# Patient Record
Sex: Male | Born: 1960 | Race: White | Hispanic: No | Marital: Married | Smoking: Current every day smoker
Health system: Southern US, Community
[De-identification: ages and names within clinical notes are randomized; demographics above are authoritative.]

---

## 1998-05-01 ENCOUNTER — Emergency Department (HOSPITAL_COMMUNITY): Admission: EM | Admit: 1998-05-01 | Discharge: 1998-05-01 | Payer: Self-pay | Admitting: Emergency Medicine

## 2010-11-27 ENCOUNTER — Emergency Department (INDEPENDENT_AMBULATORY_CARE_PROVIDER_SITE_OTHER): Payer: No Typology Code available for payment source

## 2010-11-27 ENCOUNTER — Emergency Department (HOSPITAL_BASED_OUTPATIENT_CLINIC_OR_DEPARTMENT_OTHER)
Admission: EM | Admit: 2010-11-27 | Discharge: 2010-11-27 | Disposition: A | Discharge status: Home / Self Care | Payer: No Typology Code available for payment source | Attending: Emergency Medicine | Admitting: Emergency Medicine

## 2010-11-27 DIAGNOSIS — M542 Cervicalgia: Secondary | ICD-10-CM

## 2010-11-27 DIAGNOSIS — M545 Low back pain, unspecified: Secondary | ICD-10-CM

## 2010-11-27 DIAGNOSIS — M25569 Pain in unspecified knee: Secondary | ICD-10-CM

## 2010-11-27 DIAGNOSIS — S139XXA Sprain of joints and ligaments of unspecified parts of neck, initial encounter: Secondary | ICD-10-CM | POA: Insufficient documentation

## 2010-11-27 DIAGNOSIS — S335XXA Sprain of ligaments of lumbar spine, initial encounter: Secondary | ICD-10-CM | POA: Insufficient documentation

## 2010-11-27 DIAGNOSIS — Y92410 Unspecified street and highway as the place of occurrence of the external cause: Secondary | ICD-10-CM | POA: Insufficient documentation
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
## 2010-12-13 ENCOUNTER — Emergency Department (HOSPITAL_BASED_OUTPATIENT_CLINIC_OR_DEPARTMENT_OTHER)
Admission: EM | Admit: 2010-12-13 | Discharge: 2010-12-13 | Disposition: A | Discharge status: Home / Self Care | Payer: No Typology Code available for payment source | Attending: Emergency Medicine | Admitting: Emergency Medicine

## 2010-12-13 DIAGNOSIS — F17200 Nicotine dependence, unspecified, uncomplicated: Secondary | ICD-10-CM | POA: Insufficient documentation

## 2010-12-13 DIAGNOSIS — M542 Cervicalgia: Secondary | ICD-10-CM | POA: Insufficient documentation

## 2010-12-13 DIAGNOSIS — M5430 Sciatica, unspecified side: Secondary | ICD-10-CM | POA: Insufficient documentation

## 2021-04-01 ENCOUNTER — Other Ambulatory Visit: Payer: Self-pay | Admitting: Physician Assistant

## 2021-04-01 ENCOUNTER — Other Ambulatory Visit: Payer: Self-pay

## 2021-04-01 DIAGNOSIS — S8392XA Sprain of unspecified site of left knee, initial encounter: Secondary | ICD-10-CM

## 2021-04-01 DIAGNOSIS — R200 Anesthesia of skin: Secondary | ICD-10-CM

## 2021-04-04 ENCOUNTER — Ambulatory Visit
Admission: RE | Admit: 2021-04-04 | Discharge: 2021-04-04 | Disposition: A | Discharge status: Home / Self Care | Payer: Worker's Compensation | Source: Ambulatory Visit | Attending: Physician Assistant | Admitting: Physician Assistant

## 2021-04-04 ENCOUNTER — Other Ambulatory Visit: Payer: Self-pay

## 2021-04-04 DIAGNOSIS — R200 Anesthesia of skin: Secondary | ICD-10-CM

## 2021-04-04 DIAGNOSIS — S8392XA Sprain of unspecified site of left knee, initial encounter: Secondary | ICD-10-CM

## 2021-04-04 IMAGING — MR MR KNEE*L* W/O CM
6 series · 40 of 40 positions shown · non-contrast
Comparison: None.

CLINICAL DATA: Anterior knee pain after jumping injury on
[DATE].

EXAM:
MRI OF THE LEFT KNEE WITHOUT CONTRAST
TECHNIQUE: Multiplanar, multisequence MR imaging of the knee was performed. No
intravenous contrast was administered.

[Series 6: T2 fat-sat · axial · left · 4.0mm · 0.53mm/px · z∈[-111,+60]mm · 9 of 40 slices shown (1 of 3)]
[im 1/40]
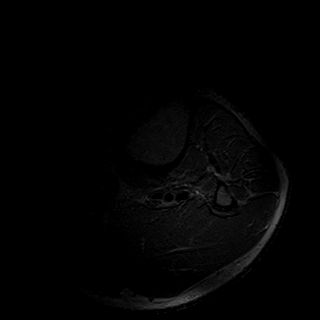
[im 5/40]
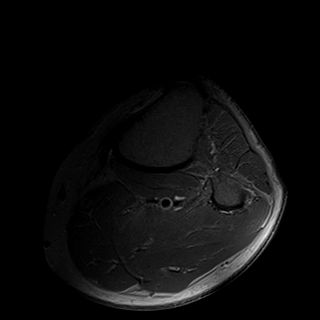
[im 10/40]
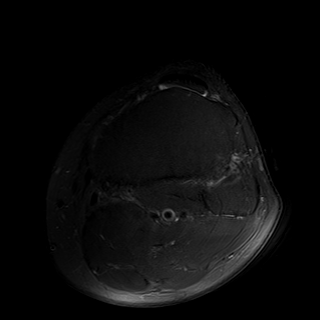
[im 15/40]
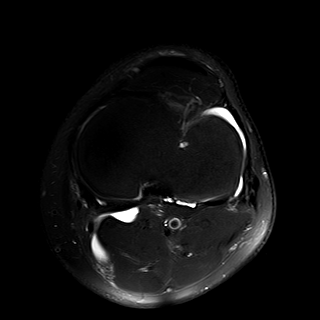
[im 20/40]
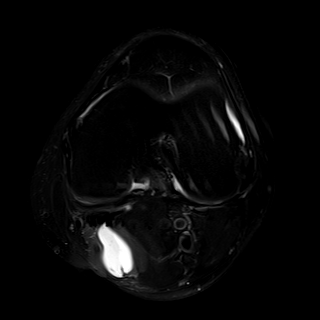
[im 25/40]
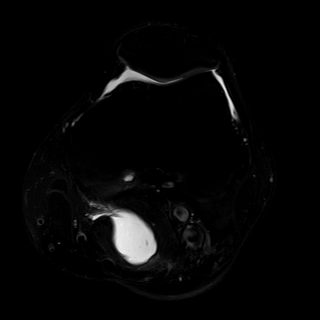
[im 30/40]
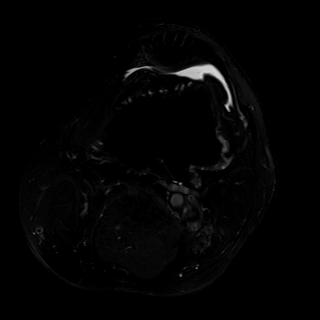
[im 35/40]
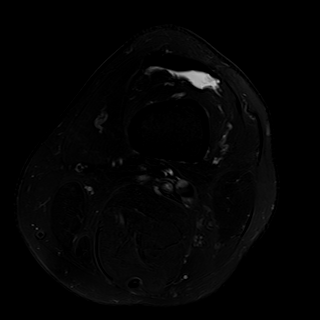
[im 40/40]
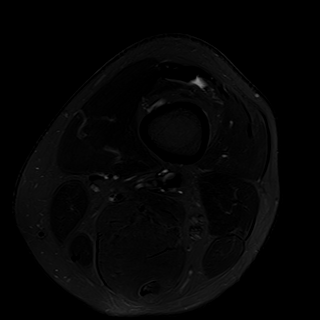

[Series 7: T2 fat-sat · coronal · left · 4.0mm · 0.50mm/px · 7 of 33 slices shown (2 of 3)]
[im 1/33]
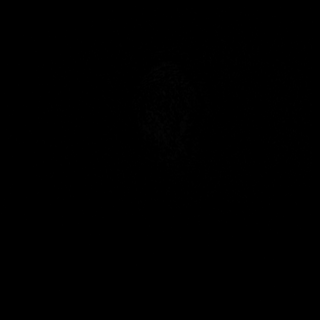
[im 6/33]
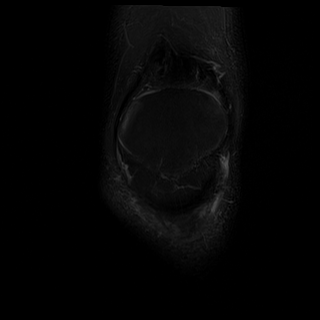
[im 11/33]
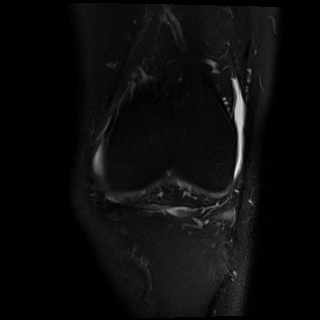
[im 17/33]
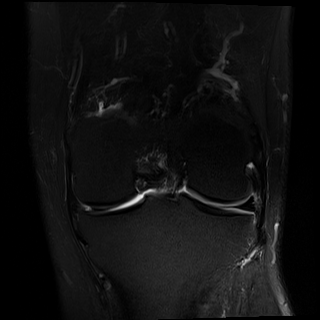
[im 22/33]
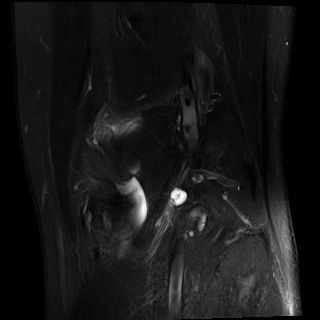
[im 27/33]
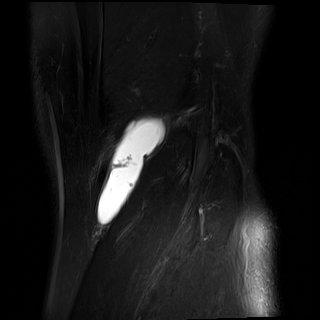
[im 33/33]
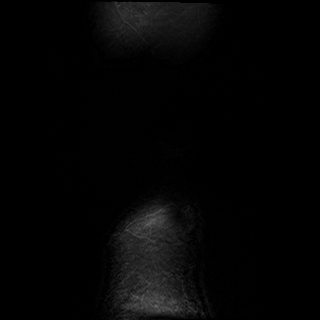

[Series 8: T1 · coronal · left · 4.0mm · 0.50mm/px · 6 of 32 slices shown]
[im 1/32]
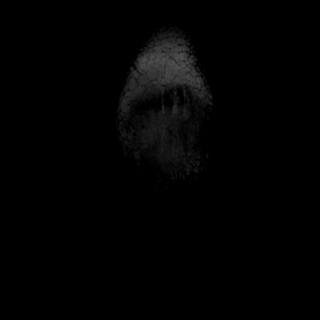
[im 7/32]
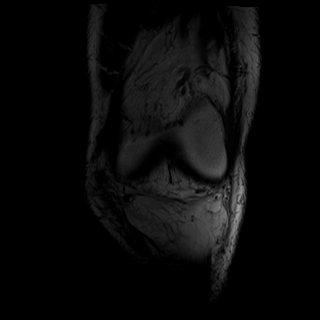
[im 13/32]
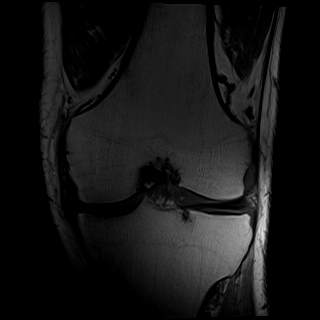
[im 19/32]
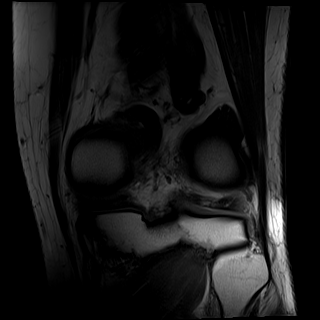
[im 25/32]
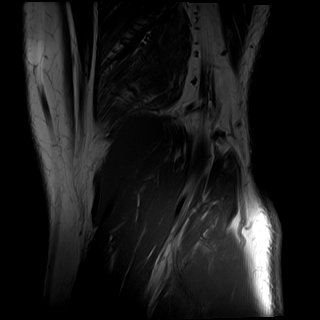
[im 32/32]
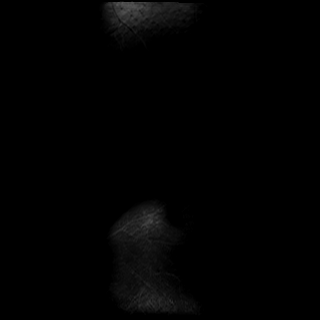

[Series 9: PD fat-sat · coronal · left · 3.0mm · 0.47mm/px · 6 of 30 slices shown (1 of 2)]
[im 1/30]
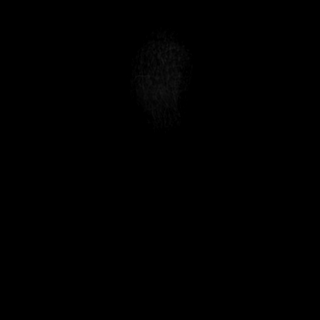
[im 6/30]
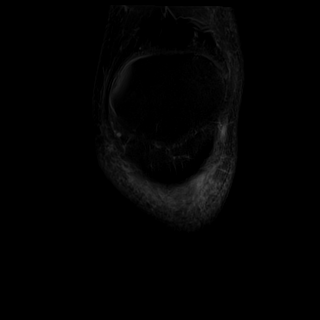
[im 12/30]
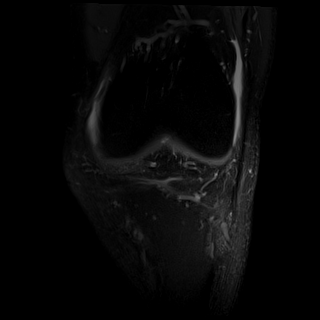
[im 18/30]
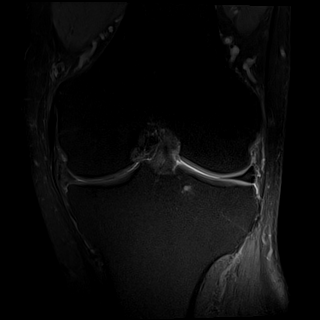
[im 24/30]
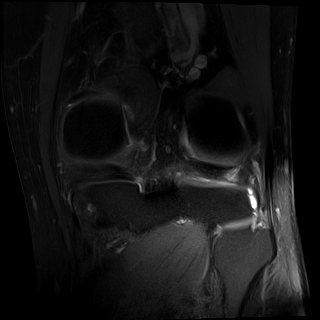
[im 30/30]
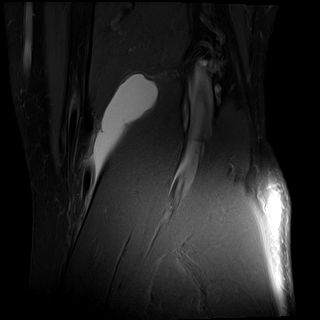

[Series 10: PD fat-sat · sagittal · left · 3.0mm · 0.53mm/px · 6 of 31 slices shown (2 of 2)]
[im 1/31]
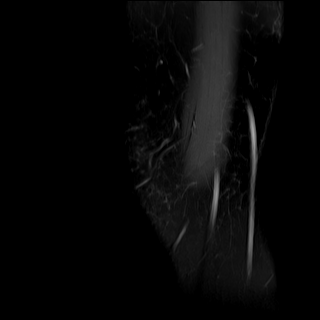
[im 7/31]
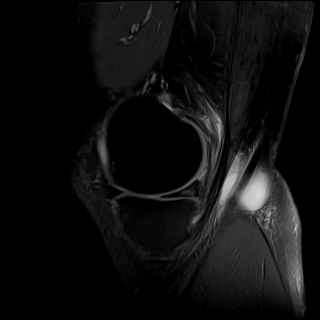
[im 13/31]
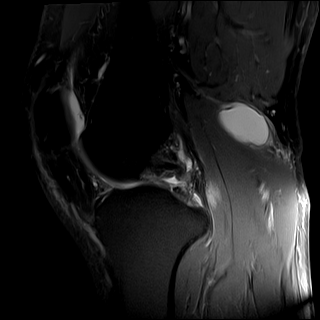
[im 19/31]
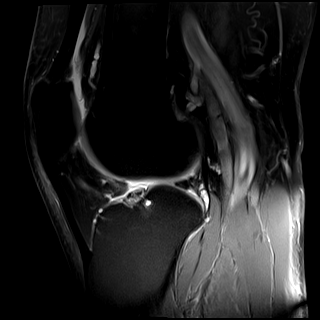
[im 25/31]
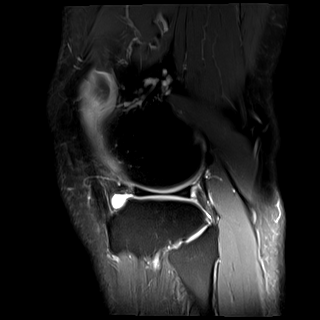
[im 31/31]
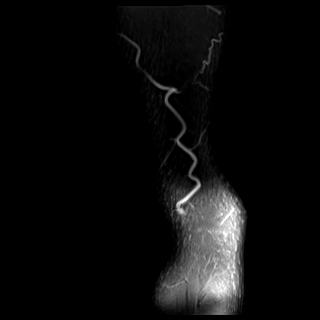

[Series 11: T2 fat-sat · sagittal · left · 3.0mm · 0.53mm/px · 6 of 32 slices shown (3 of 3)]
[im 1/32]
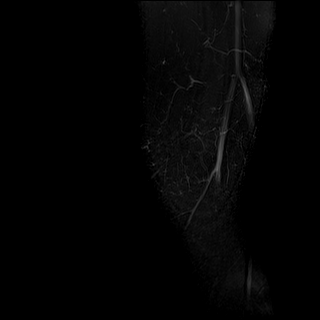
[im 7/32]
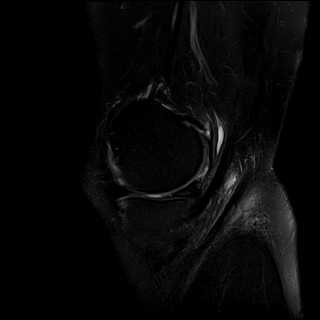
[im 13/32]
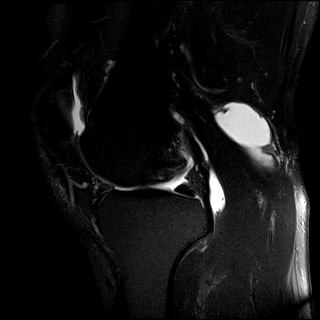
[im 19/32]
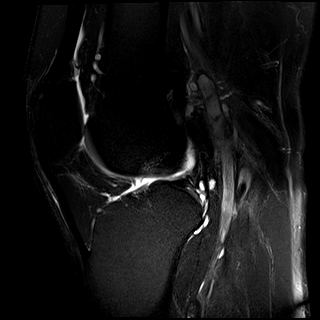
[im 25/32]
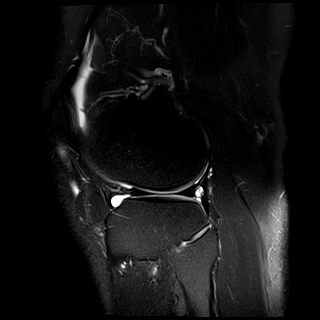
[im 32/32]
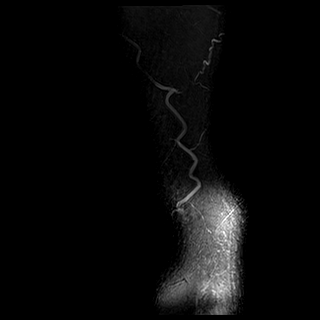

[40 of 40 positions shown; findings below may reference images not displayed]

FINDINGS: MENISCI

Medial meniscus: HYELLAMADA radial tear of the midbody (series 6,
images 24-25). Intermediate intrasubstance signal within the
remainder of the medial meniscal body and the posterior horn which
may represent intrasubstance degeneration or meniscal contusion.

Lateral meniscus:  Intact.

LIGAMENTS

Cruciates:  Intact ACL and PCL.

Collaterals: Medial collateral ligament is intact. Lateral
collateral ligament complex is intact.

CARTILAGE

Patellofemoral: Full-thickness chondral defect within the central
trochlear groove measuring 8 x 2 mm. Partial thickness chondral
fissure along the inferior aspect of the lateral patellar facet.

Medial:  No chondral defect.

Lateral:  No chondral defect.

Joint:  Small knee joint effusion.  Fat pads within normal limits.

Popliteal Fossa: Moderate-sized Baker's cyst measuring up to 6.0 cm
in length. Intact popliteus tendon.

Extensor Mechanism:  Intact quadriceps tendon and patellar tendon.

Bones: No focal marrow signal abnormality. No fracture or
dislocation.

Other: Multilobulated cystic structure along the posterior aspect of
the lateral tibial plateau measuring 2.3 x 0.8 x 2.0 cm, likely
ganglion cyst.
IMPRESSION: 1. Radial tear of the medial meniscal body.
2. Full-thickness chondral defect within the central trochlear
groove measuring 8 x 2 mm.
3. Small knee joint effusion.
4. Moderate-sized Baker's cyst measuring up to 6.0 cm in length.
5. Multilobulated cystic structure along the posterior aspect of the
lateral tibial plateau measuring 2.3 x 0.8 x 2.0 cm, likely ganglion
cyst.

## 2021-04-26 ENCOUNTER — Other Ambulatory Visit: Payer: Self-pay

## 2021-04-26 ENCOUNTER — Encounter: Payer: Self-pay | Admitting: Orthopaedic Surgery

## 2021-04-26 ENCOUNTER — Ambulatory Visit (INDEPENDENT_AMBULATORY_CARE_PROVIDER_SITE_OTHER): Admitting: Orthopaedic Surgery

## 2021-04-26 DIAGNOSIS — S83242D Other tear of medial meniscus, current injury, left knee, subsequent encounter: Secondary | ICD-10-CM | POA: Diagnosis not present

## 2021-04-26 MED ORDER — TRAMADOL HCL 50 MG PO TABS: 50.0000 mg | ORAL_TABLET | Freq: Four times a day (QID) | ORAL | 0 refills | Status: DC | PRN

## 2021-04-26 NOTE — Progress Notes (Signed)
Office Visit Note   Patient: Fred Rodriguez           Date of Birth: [birth year]           MRN: [id number] Visit Date: 04/26/2021              Requested by: No referring provider defined for this encounter. PCP: Pcp, No   Assessment & Plan: Visit Diagnoses:  1. Other tear of medial meniscus, current injury, left knee, subsequent encounter     Plan: Fred Rodriguez sustained an on-the-job injury to his left knee on March 09, 2021.  He jumped about 5 to 6 feet off of his truck landing on both feet with immediate onset of left knee pain.  He was seen by his primary care physician with x-rays negative for any acute injury.  He then had an MRI scan of his knee performed on 04/04/2021.  This demonstrated a parrot-beak radial tear of the mid body of the medial meniscus.  Lateral meniscus was intact.  ACL and PCL were intact.  Collateral ligaments intact.  There was an area of full-thickness chondral defect within the central trochlear groove measuring 8 x 2 mm and some partial-thickness chondral fissure along the inferior aspect of the lateral patella facet.  No chondral defects in the medial lateral compartment.  There was a moderate sized Baker's cyst measuring up to 6 cm.  Also was a lateral tibial plateau ganglion cyst measuring 2.3 x 1.8 x 2 cm.  No symptoms prior to the injury.  He continues to have trouble walking but tickly up and down steps and sleeping.  I believe the medial meniscus tear is symptomatic.  He does not have any pain with patella compression or flexion extension.  All this pain is localized along the medial compartment.  Long discussion regarding the pathology and findings on the MRI scan.  I would suggest a knee arthroscopy.  Discussed this with him in detail regarding the incisions, outpatient nature what he may expect postoperatively.  We will give him a note saying he cannot work until after surgery and tramadol for pain  Follow-Up Instructions: Return We will schedule knee arthroscopy.    Orders:  No orders of the defined types were placed in this encounter.  No orders of the defined types were placed in this encounter.     Procedures: No procedures performed   Clinical Data: No additional findings.   Subjective: Chief Complaint  Patient presents with   Left Knee - Pain  Patient presents today for his left knee. He states that he jumped off the hood of a truck on 03/09/2021 and landed on his feet. He states that he had pain in the medial side of his knee. He feels like he reinjured his left knee from jumping down. His original injury occurred at the end of 2021. He went to [facility] and had x-rays. He then had an MRI scan. He states that he experiences intense pain. He cannot sleep. He is taking prednisone and states that it has not helped. 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   HPI  Review of Systems   Objective: Vital Signs: Ht 6\' 1"  (1.854 m)   Wt 260 lb (117.9 kg)   BMI 34.30 kg/m   Physical Exam Constitutional:      Appearance: He is well-developed.  Eyes:     Pupils: Pupils are equal, round, and reactive to light.  Pulmonary:     Effort: Pulmonary effort is normal.  Skin:  General: Skin is warm and dry.  Neurological:     Mental Status: He is alert and oriented to person, place, and time.  Psychiatric:        Behavior: Behavior normal.    Ortho Exam left knee with diffuse posterior medial joint pain.  Very minimal effusion.  Some patella crepitation but no pain with patella compression.  Full extension and flexion at least 205 degrees.  No instability.  No pain about the collateral ligaments.  Negative anterior drawer sign.  I did not feel any popliteal fullness.  No calf pain.  No distal edema.  Straight leg raise negative.  Specialty Comments:  No specialty comments available.  Imaging: No results found.   PMFS History: Patient Active Problem List   Diagnosis Date Noted   Other tear of medial meniscus, current injury, left knee, subsequent  encounter 04/26/2021   History reviewed. No pertinent past medical history.  History reviewed. No pertinent family history.  History reviewed. No pertinent surgical history. Social History   Occupational History   Not on file  Tobacco Use   Smoking status: Not on file   Smokeless tobacco: Not on file  Substance and Sexual Activity   Alcohol use: Not on file   Drug use: Not on file   Sexual activity: Not on file

## 2021-05-12 ENCOUNTER — Other Ambulatory Visit: Payer: Self-pay | Admitting: Orthopaedic Surgery

## 2021-05-12 ENCOUNTER — Encounter: Payer: Self-pay | Admitting: Orthopaedic Surgery

## 2021-05-12 DIAGNOSIS — M23222 Derangement of posterior horn of medial meniscus due to old tear or injury, left knee: Secondary | ICD-10-CM | POA: Diagnosis not present

## 2021-05-12 DIAGNOSIS — M94262 Chondromalacia, left knee: Secondary | ICD-10-CM | POA: Diagnosis not present

## 2021-05-12 DIAGNOSIS — M2242 Chondromalacia patellae, left knee: Secondary | ICD-10-CM | POA: Diagnosis not present

## 2021-05-12 MED ORDER — HYDROCODONE-ACETAMINOPHEN 5-325 MG PO TABS: 1.0000 | ORAL_TABLET | Freq: Four times a day (QID) | ORAL | 0 refills | Status: DC | PRN

## 2021-05-19 ENCOUNTER — Encounter: Payer: Self-pay | Admitting: Orthopaedic Surgery

## 2021-05-19 ENCOUNTER — Encounter: Payer: Self-pay | Admitting: Orthopedic Surgery

## 2021-05-19 ENCOUNTER — Other Ambulatory Visit: Payer: Self-pay

## 2021-05-19 ENCOUNTER — Ambulatory Visit (INDEPENDENT_AMBULATORY_CARE_PROVIDER_SITE_OTHER): Admitting: Orthopaedic Surgery

## 2021-05-19 DIAGNOSIS — S83242D Other tear of medial meniscus, current injury, left knee, subsequent encounter: Secondary | ICD-10-CM

## 2021-05-19 MED ORDER — LIDOCAINE HCL 1 % IJ SOLN
2.0000 mL | INTRAMUSCULAR | Status: AC | PRN
  Administered 2021-05-19: 2 mL

## 2021-05-19 NOTE — Progress Notes (Signed)
   Office Visit Note   Patient: Fred Rodriguez           Date of Birth: 03-12-1961           MRN: [id number] Visit Date: 05/19/2021              Requested by: No referring provider defined for this encounter. PCP: Pcp, No   Assessment & Plan: Visit Diagnoses:  1. Other tear of medial meniscus, current injury, left knee, subsequent encounter     Plan: 1 week status post left knee arthroscopy there was some chondromalacia the medial compartment but radial tear at the junction of the mid and posterior horns of the medial meniscus.  This was debrided.  He does have a hemarthrosis so I have aspirated his knee of about 50 cc of fluid and it made a big difference.  No evidence of infection or DVT.  We will maintain his out of work status and reevaluate in 1 week  Follow-Up Instructions: Return in about 1 week (around 05/26/2021).   Orders:  No orders of the defined types were placed in this encounter.  No orders of the defined types were placed in this encounter.     Procedures: Large Joint Inj: L knee on 05/19/2021 1:29 PM Indications: pain and diagnostic evaluation Details: 25 G 1.5 in needle, anteromedial approach  Arthrogram: No  Medications: 2 mL lidocaine 1 % Aspirate: 50 mL blood-tinged Outcome: tolerated well, no immediate complications Procedure, treatment alternatives, risks and benefits explained, specific risks discussed. Consent was given by the patient. Patient was prepped and draped in the usual sterile fashion.      Clinical Data: No additional findings.   Subjective: Chief Complaint  Patient presents with   Left Knee - Pain  1 week status post left knee arthroscopy for partial medial meniscectomy.  Doing well except that he is having some pressure feeling in his knee and has a positive effusion.  No shortness of breath or chest pain or calf discomfort.  Has had side effects from the pain medicine with difficulty sleeping.  HPI  Review of  Systems   Objective: Vital Signs: There were no vitals taken for this visit.  Physical Exam  Ortho Exam positive effusion left knee.  No significant tenderness.  Arthroscopic portals healing without problem.  No evidence of infection.  No thigh popliteal space or calf pain.  No distal edema.  Does have limited range of motion but is based on the effusion  Specialty Comments:  No specialty comments available.  Imaging: No results found.   PMFS History: Patient Active Problem List   Diagnosis Date Noted   Other tear of medial meniscus, current injury, left knee, subsequent encounter 04/26/2021   History reviewed. No pertinent past medical history.  History reviewed. No pertinent family history.  History reviewed. No pertinent surgical history. Social History   Occupational History   Not on file  Tobacco Use   Smoking status: Not on file   Smokeless tobacco: Not on file  Substance and Sexual Activity   Alcohol use: Not on file   Drug use: Not on file   Sexual activity: Not on file     Valeria Batman, MD   Note - This record has been created using [employer].  Chart creation errors have been sought, but may not always  have been located. Such creation errors do not reflect on  the standard of medical care.

## 2021-05-26 ENCOUNTER — Other Ambulatory Visit: Payer: Self-pay

## 2021-05-26 ENCOUNTER — Encounter: Payer: Self-pay | Admitting: Orthopaedic Surgery

## 2021-05-26 ENCOUNTER — Ambulatory Visit (INDEPENDENT_AMBULATORY_CARE_PROVIDER_SITE_OTHER): Admitting: Orthopaedic Surgery

## 2021-05-26 DIAGNOSIS — S83242D Other tear of medial meniscus, current injury, left knee, subsequent encounter: Secondary | ICD-10-CM

## 2021-05-26 NOTE — Progress Notes (Signed)
   Office Visit Note   Patient: Fred Rodriguez           Date of Birth: 1961/05/17           MRN: [id number] Visit Date: 05/26/2021              Requested by: No referring provider defined for this encounter. PCP: Pcp, No   Assessment & Plan: Visit Diagnoses:  1. Other tear of medial meniscus, current injury, left knee, subsequent encounter     Plan: 2-week status post left knee arthroscopy.  I performed a partial medial meniscectomy for a radial tear.  Last week I aspirated an effusion he notes that it made a huge difference.  It has not recurred.  He is doing well.  Not using any ambulatory aid.  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  He has not reached maximum medical improvement  Follow-Up Instructions: Return in about 2 weeks (around 06/09/2021).   Orders:  No orders of the defined types were placed in this encounter.  No orders of the defined types were placed in this encounter.     Procedures: No procedures performed   Clinical Data: No additional findings.   Subjective: Chief Complaint  Patient presents with   Left Knee - Follow-up    Left knee arthroscopy 05/12/2021  Patient presents today for follow up on his left knee. He is now two weeks out from a left knee arthroscopy. He is doing well.   HPI  Review of Systems   Objective: Vital Signs: There were no vitals taken for this visit.  Physical Exam  Ortho Exam left knee was not hot red warm or swollen.  Arthroscopic portals healing without problem.  No popliteal pain or mass.  No calf pain.  Neurologically intact.  Full quick extension and flexed over 105 degrees.  Minimal medial joint pain  Specialty Comments:  No specialty comments available.  Imaging: No results found.   PMFS History: Patient Active Problem List    Diagnosis Date Noted   Other tear of medial meniscus, current injury, left knee, subsequent encounter 04/26/2021   History reviewed. No pertinent past medical history.  History reviewed. No pertinent family history.  History reviewed. No pertinent surgical history. Social History   Occupational History   Not on file  Tobacco Use   Smoking status: Not on file   Smokeless tobacco: Not on file  Substance and Sexual Activity   Alcohol use: Not on file   Drug use: Not on file   Sexual activity: Not on file

## 2021-06-09 ENCOUNTER — Ambulatory Visit (INDEPENDENT_AMBULATORY_CARE_PROVIDER_SITE_OTHER): Admitting: Orthopaedic Surgery

## 2021-06-09 ENCOUNTER — Other Ambulatory Visit: Payer: Self-pay

## 2021-06-09 ENCOUNTER — Encounter: Payer: Self-pay | Admitting: Orthopaedic Surgery

## 2021-06-09 VITALS — Ht 73.0 in | Wt 260.0 lb

## 2021-06-09 DIAGNOSIS — S83242D Other tear of medial meniscus, current injury, left knee, subsequent encounter: Secondary | ICD-10-CM

## 2021-06-09 DIAGNOSIS — Z9889 Other specified postprocedural states: Secondary | ICD-10-CM

## 2021-06-09 NOTE — Progress Notes (Signed)
   Office Visit Note   Patient: Fred Rodriguez           Date of Birth: 12-08-1960           MRN: [id number] Visit Date: 06/09/2021              Requested by: No referring provider defined for this encounter. PCP: Pcp, No   Assessment & Plan: Visit Diagnoses:  1. S/P left knee arthroscopy   2. Other tear of medial meniscus, current injury, left knee, subsequent encounter     Plan: Fred Rodriguez is 1 month status post left knee arthroscopy had complex tear of the posterior third of the medial meniscus.  This was debrided back to stable meniscal rim.  He notes that he is doing quite well but still has occasional pain when he twists and turns.  Is not had any effusion or any evidence of infection.  His biggest concern is his weakness.  We will send him to physical therapy and check him back in a month.  He is seeing Fred Rodriguez for his back and is being kept out of work through his office  Follow-Up Instructions: Return in about 1 month (around 07/09/2021).   Orders:  Orders Placed This Encounter  Procedures   Ambulatory referral to Physical Therapy   No orders of the defined types were placed in this encounter.     Procedures: No procedures performed   Clinical Data: No additional findings.   Subjective: Chief Complaint  Patient presents with   Left Knee - Follow-up    Left knee arthroscopy 05/12/2021  Patient presents today for a two week follow up on his left knee. He is now a month out from a left knee arthroscopy. His surgery was on 05/12/2021. He states that he is doing well, but feels like he doesn't have much strength in that knee.  HPI  Review of Systems   Objective: Vital Signs: Ht 6\' 1"  (1.854 m)   Wt 260 lb (117.9 kg)   BMI 34.30 kg/m   Physical Exam  Ortho Exam left knee was not hot warm or red.  No effusion.  Full quick extension.  There is some atrophy of the left compared to the right thigh.  Flexed over 105 degrees.  Very minimal posterior medial joint  pain.  No patella crepitation and no lateral joint pain  Specialty Comments:  No specialty comments available.  Imaging: No results found.   PMFS History: Patient Active Problem List   Diagnosis Date Noted   Other tear of medial meniscus, current injury, left knee, subsequent encounter 04/26/2021   History reviewed. No pertinent past medical history.  History reviewed. No pertinent family history.  History reviewed. No pertinent surgical history. Social History   Occupational History   Not on file  Tobacco Use   Smoking status: Not on file   Smokeless tobacco: Not on file  Substance and Sexual Activity   Alcohol use: Not on file   Drug use: Not on file   Sexual activity: Not on file

## 2021-06-16 ENCOUNTER — Other Ambulatory Visit: Payer: Self-pay | Admitting: Orthopedic Surgery

## 2021-06-16 DIAGNOSIS — M545 Low back pain, unspecified: Secondary | ICD-10-CM

## 2021-06-30 ENCOUNTER — Ambulatory Visit (INDEPENDENT_AMBULATORY_CARE_PROVIDER_SITE_OTHER): Admitting: Orthopaedic Surgery

## 2021-06-30 ENCOUNTER — Other Ambulatory Visit: Payer: Self-pay

## 2021-06-30 ENCOUNTER — Encounter: Payer: Self-pay | Admitting: Orthopaedic Surgery

## 2021-06-30 DIAGNOSIS — S83242D Other tear of medial meniscus, current injury, left knee, subsequent encounter: Secondary | ICD-10-CM

## 2021-06-30 NOTE — Progress Notes (Signed)
Office Visit Note   Patient: Fred Rodriguez           Date of Birth: [birth year]           MRN: [id number] Visit Date: 06/30/2021              Requested by: No referring provider defined for this encounter. PCP: Pcp, No   Assessment & Plan: Visit Diagnoses:  1. Other tear of medial meniscus, current injury, left knee, subsequent encounter     Plan: Fred Rodriguez is nearly 2 months status post left knee arthroscopy.  He had a complex tear of the medial meniscus that was debrided.  There was some grade 2 changes of chondromalacia of the medial lateral compartment with grade 2 and 3 changes of chondromalacia at the patellofemoral articulation and the trochlear.  I think that is causing him more trouble as he is having difficulty with deep knee bends and squats.  The meniscal pain has resolved.  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  He will need to bend and squat without much pain.  He will continue with his physical therapy and I will monitor him on a monthly basis.  He also is being followed by Dr. Yevette Edwards for his lumbar spine and has MRI scan scheduled for tomorrow.  He has not reached maximal medical improvement and may not for up to 6 months.  Unable to work from my standpoint  Follow-Up Instructions: Return in about 1 month (around 07/31/2021).   Orders:  No orders of the defined types were placed in this encounter.  No orders of the defined types were placed in this encounter.     Procedures: No procedures performed   Clinical Data: No additional findings.   Subjective: Chief Complaint  Patient presents with   Left Knee - Follow-up    Left knee arthroscopy 05/12/2021  Patient presents today for follow up on his left knee. He had a left knee arthroscopy on 05/12/2021. He is going to physical therapy twice weekly. He is very discouraged today because he cannot get the range of motion and strength back into his leg. 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   HPI  Review of Systems   Objective: Vital Signs: There were no vitals taken for this visit.  Physical Exam  Ortho Exam left knee was not hot red warm or swollen.  No medial joint pain but there was pain with compression of the patella with flexion extension.  He might have a very minimal effusion.  There was some pain when he tried to perform a squat related to the anterior aspect of his knee.  No instability  Specialty Comments:  No specialty comments available.  Imaging: No results found.   PMFS History: Patient Active Problem List   Diagnosis Date Noted   Other tear of medial meniscus, current injury, left knee, subsequent encounter 04/26/2021   History reviewed. No pertinent past medical history.  History reviewed. No pertinent family history.  History reviewed. No pertinent surgical history. Social History   Occupational History   Not on file  Tobacco Use   Smoking status: Not on file   Smokeless tobacco: Not on file  Substance and Sexual Activity   Alcohol use: Not on file   Drug use: Not on file   Sexual activity: Not on file

## 2021-07-01 ENCOUNTER — Ambulatory Visit
Admission: RE | Admit: 2021-07-01 | Discharge: 2021-07-01 | Disposition: A | Discharge status: Home / Self Care | Source: Ambulatory Visit | Attending: Orthopedic Surgery | Admitting: Orthopedic Surgery

## 2021-07-01 DIAGNOSIS — M545 Low back pain, unspecified: Secondary | ICD-10-CM

## 2021-07-01 IMAGING — MR MR LUMBAR SPINE W/O CM
4 of 5 series · 18 of 48 positions shown · non-contrast
Comparison: None.

CLINICAL DATA: Low back pain, sciatica

EXAM:
MRI LUMBAR SPINE WITHOUT CONTRAST
TECHNIQUE: Multiplanar, multisequence MR imaging of the lumbar spine was
performed. No intravenous contrast was administered.

[Series 6: T1 · sagittal · 4.0mm · 0.73mm/px · 3 of 17 slices shown (1 of 2)]
[im 4/17]
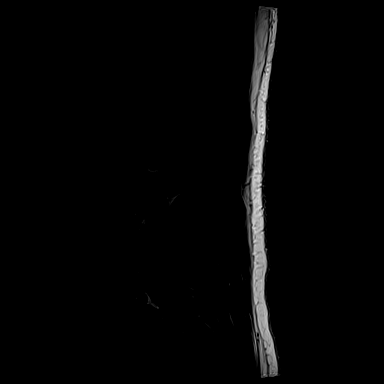
[im 10/17]
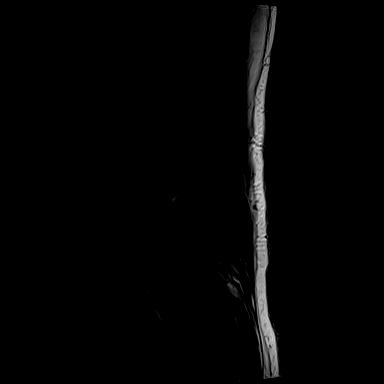
[im 17/17]
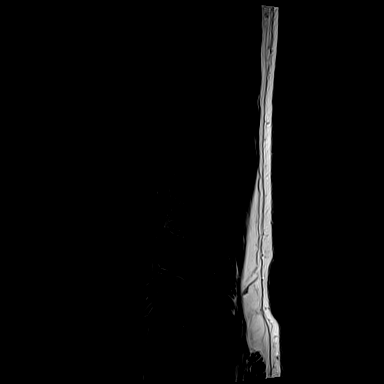

[Series 8: T2 · sagittal · 4.0mm · 0.73mm/px · 6 of 17 slices shown (1 of 2)]
[im 1/17]
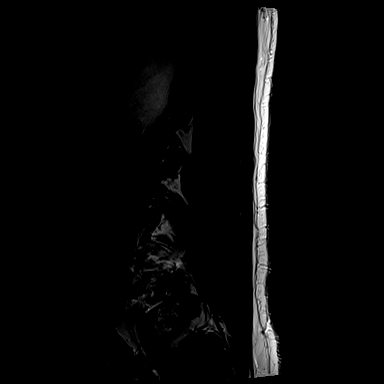
[im 4/17]
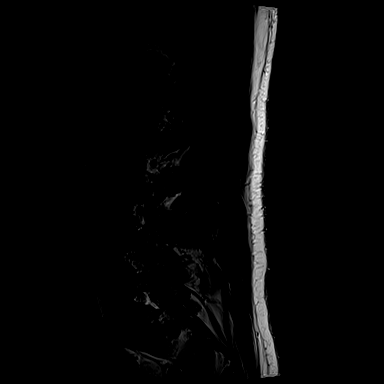
[im 7/17]
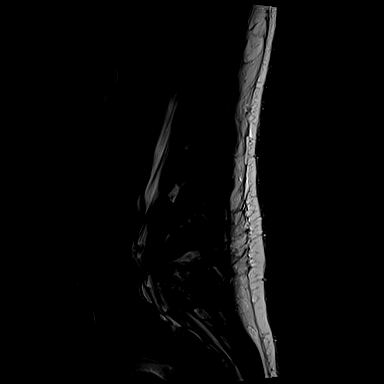
[im 10/17]
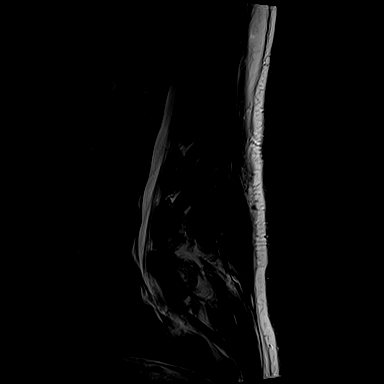
[im 13/17]
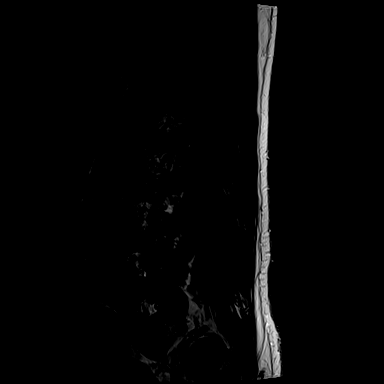
[im 17/17]
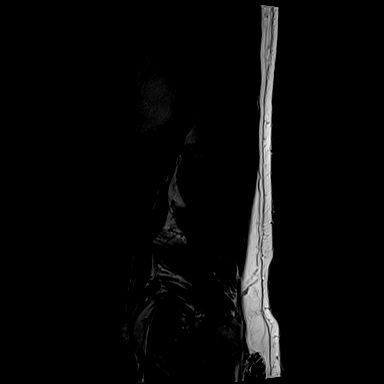

[Series 14: T2 · axial · 4.0mm · 0.28mm/px · z∈[-155,+53]mm · 6 of 45 slices shown (2 of 2)]
[im 1/45]
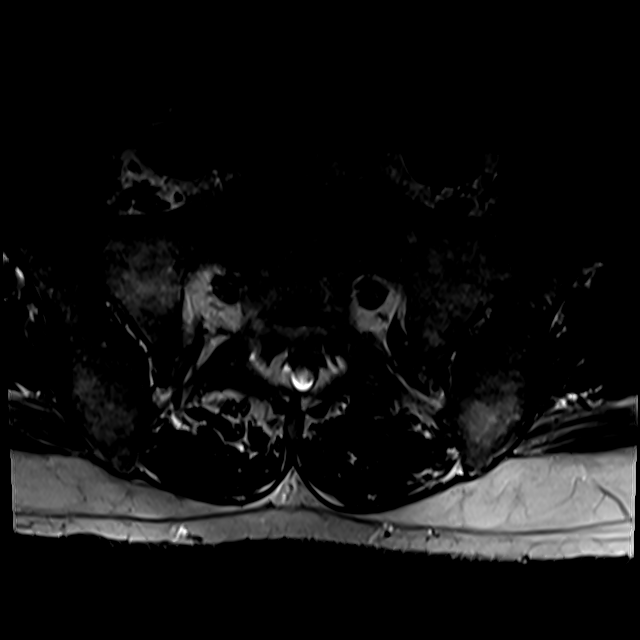
[im 7/45]
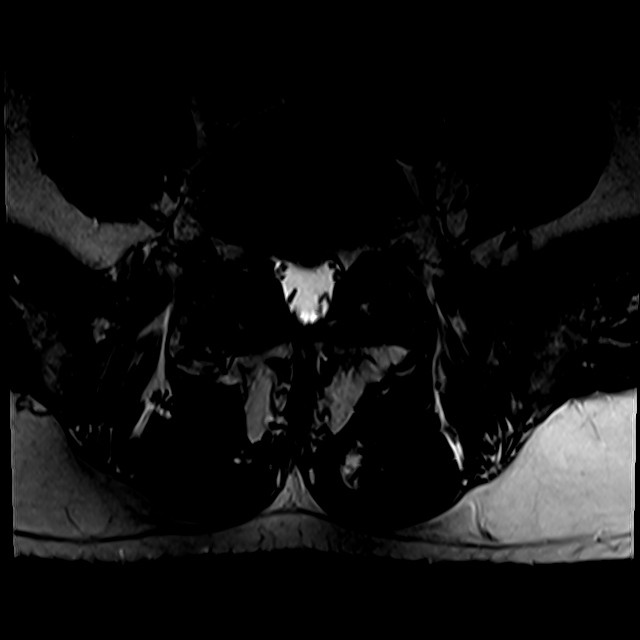
[im 13/45]
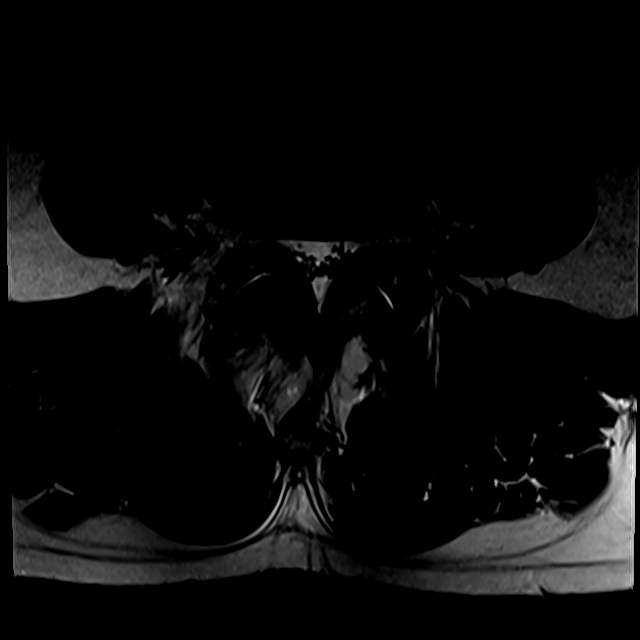
[im 19/45]
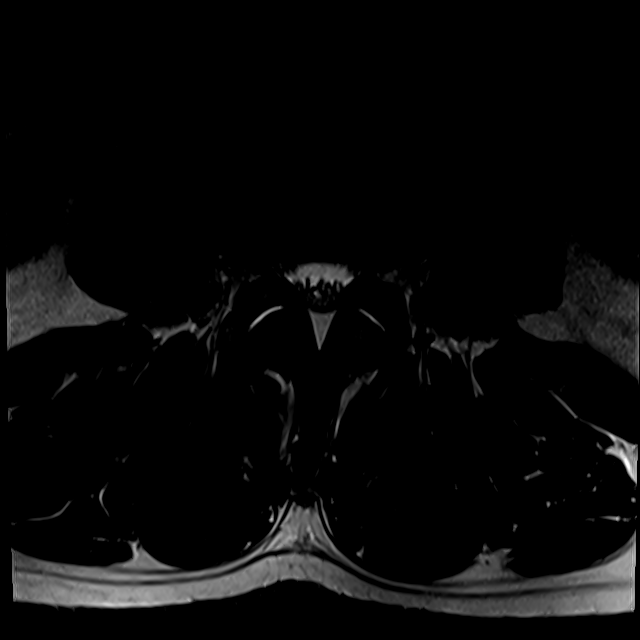
[im 23/45]
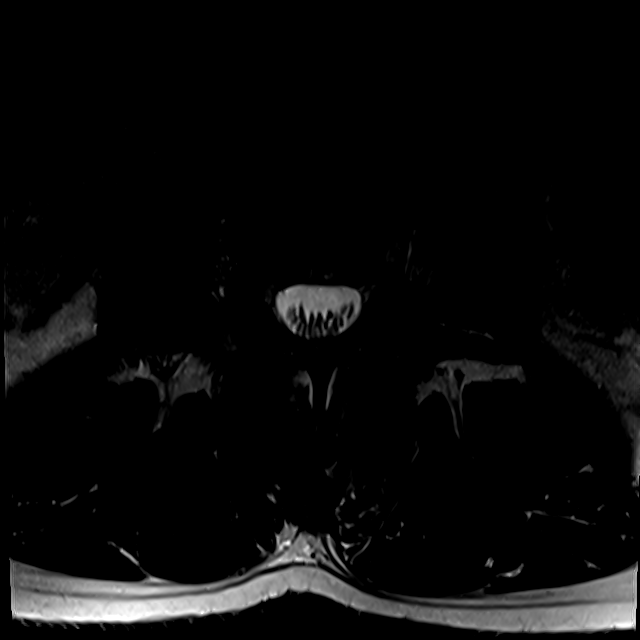
[im 38/45]
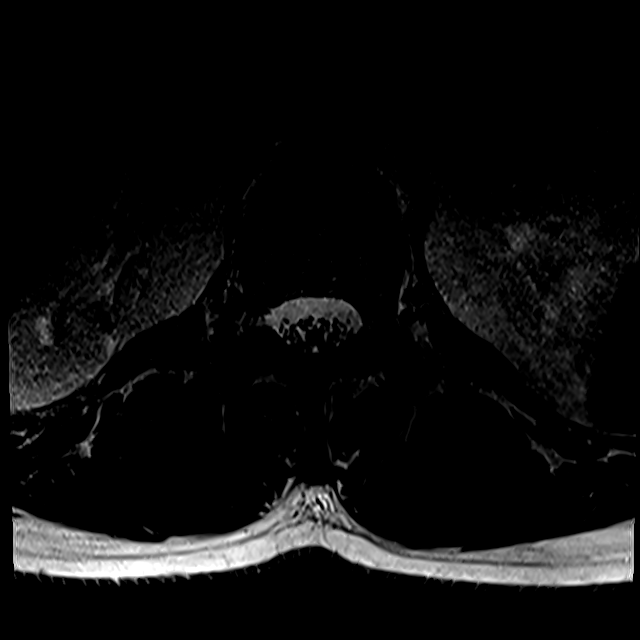

[Series 100: T1 · axial · 4.0mm · 0.28mm/px · z∈[-125,+53]mm · 3 of 45 slices shown (2 of 2)]
[im 7/45]
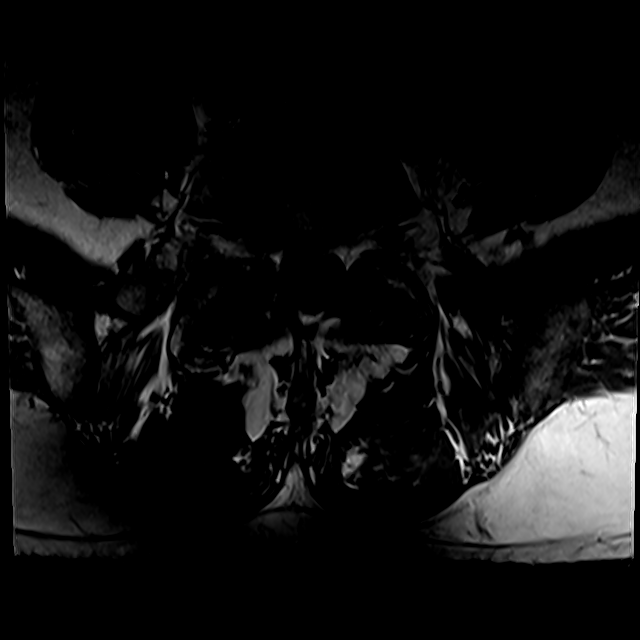
[im 23/45]
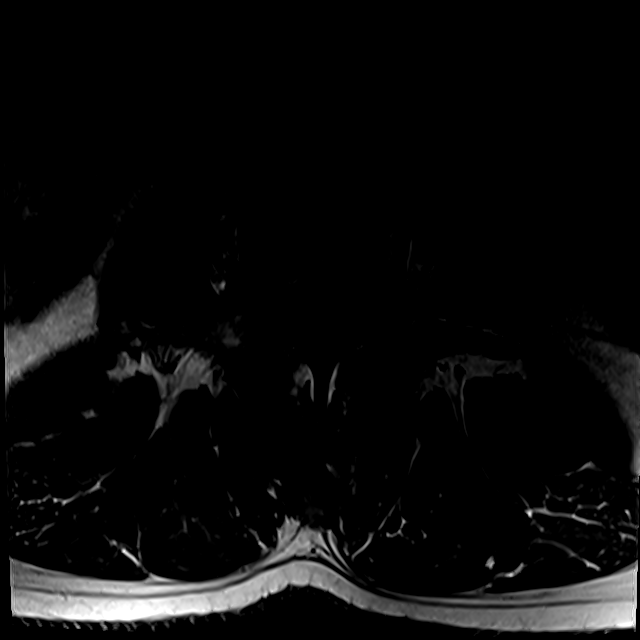
[im 38/45]
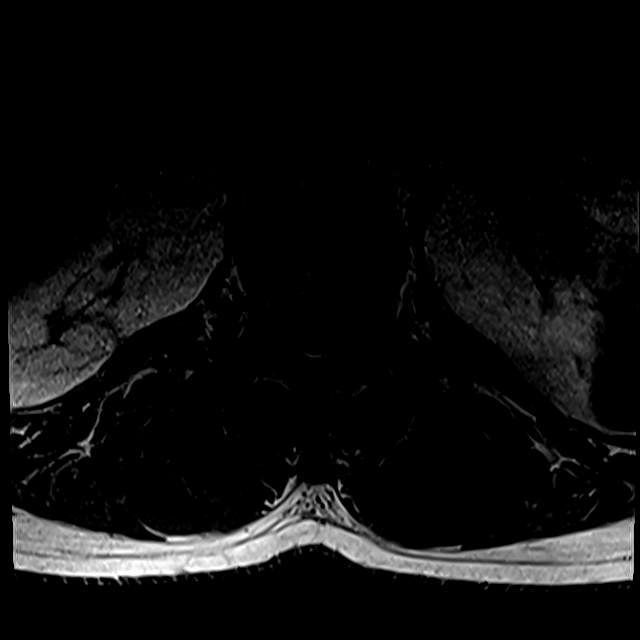

[18 of 48 positions shown; findings below may reference images not displayed]

FINDINGS: Segmentation:  Standard.

Alignment:  Physiologic.

Vertebrae: No acute fracture, evidence of discitis, or aggressive
bone lesion.

Conus medullaris and cauda equina: Conus extends to the L1 level.
Conus and cauda equina appear normal.

Paraspinal and other soft tissues: No acute paraspinal abnormality.

Other: Mild osteoarthritis of bilateral SI joints.

Disc levels:

Disc spaces: Mild disc desiccation at L3-4. Disc heights are
maintained.

T12-L1: No significant disc bulge. No neural foraminal stenosis. No
central canal stenosis.

L1-L2: No significant disc bulge. No neural foraminal stenosis. No
central canal stenosis.

L2-L3: No significant disc bulge. No neural foraminal stenosis. No
central canal stenosis.

L3-L4: Minimal broad-based disc bulge. Mild bilateral facet
arthropathy. Mild right foraminal stenosis. No left foraminal
stenosis. No spinal stenosis.

L4-L5: Broad-based disc bulge. Severe bilateral facet arthropathy
with reactive marrow edema on either side of the facet joint.
Moderate right foraminal stenosis. No left foraminal stenosis. Mild
spinal stenosis. Mild right lateral recess narrowing.

L5-S1: Broad-based disc bulge. Severe right and moderate left facet
arthropathy with marrow edema on either side of the right facet
joint. No spinal stenosis.
IMPRESSION: 1. Lower lumbar spine spondylosis as described above.
2. No acute osseous injury of the lumbar spine.

## 2021-08-03 ENCOUNTER — Other Ambulatory Visit: Payer: Self-pay

## 2021-08-03 ENCOUNTER — Telehealth: Payer: Self-pay

## 2021-08-03 ENCOUNTER — Ambulatory Visit (INDEPENDENT_AMBULATORY_CARE_PROVIDER_SITE_OTHER): Admitting: Orthopaedic Surgery

## 2021-08-03 ENCOUNTER — Encounter: Payer: Self-pay | Admitting: Orthopaedic Surgery

## 2021-08-03 DIAGNOSIS — S83242D Other tear of medial meniscus, current injury, left knee, subsequent encounter: Secondary | ICD-10-CM

## 2021-08-03 NOTE — Telephone Encounter (Signed)
Hey it would be for a series of 3 for the gel injection. Probably, Orthovisc.

## 2021-08-03 NOTE — Telephone Encounter (Signed)
Gave script for left knee visco injections to [employer] with Workers [employer].

## 2021-08-03 NOTE — Telephone Encounter (Signed)
Please precert for left knee visco. 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 Dr.Whitfield's patient. Thanks.

## 2021-08-03 NOTE — Telephone Encounter (Signed)
Talked with Morrison Old and Lauren G.concerning order for gel injection.

## 2021-08-03 NOTE — Progress Notes (Signed)
Office Visit Note   Patient: Fred Rodriguez           Date of Birth: 1960-10-29           MRN: [id number] Visit Date: 08/03/2021              Requested by: No referring provider defined for this encounter. PCP: Pcp, No   Assessment & Plan: Visit Diagnoses: No diagnosis found.  Plan: Patient is 3 months status post left knee arthroscopy debridement of medial meniscus tear.  He has continued pain especially with squatting kneeling and crawling.  This can cause his knee to give out.  The knee pain is focused around the medial side of his joint line and in the medial side of the patellofemoral joint.  This also makes it difficult for him to descend stairs.  From an orthopedic standpoint with regards to his knee it will be 6 months from his knee arthroscopy before we can determine maximum medical improvement.  In the meantime he may benefit from viscosupplementation and we will go forward for authorization .  He understands that ultimately the only solution to his pain may be a knee replacement.  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  We will give him a note that he cannot do any kneeling crawling or squatting and in our medical opinion should be off work until we see him.  We will contact him with approval for the viscosupplementation  Follow-Up Instructions: No follow-ups on file.   Orders:  No orders of the defined types were placed in this encounter.  No orders of the defined types were placed in this encounter.     Procedures: No procedures performed   Clinical Data: No additional findings.   Subjective: No chief complaint on file. Patient presents today for follow up on his left knee. He had a left knee arthroscopy on 05/12/2021. He feels like his knee has improved as much as it will. He is still having pain. He is not taking anything currently for pain. 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     Review  of Systems  All other systems reviewed and are negative.   Objective: Vital Signs: There were no vitals taken for this visit.  Physical Exam Constitutional:      Appearance: Normal appearance.  Pulmonary:     Effort: Pulmonary effort is normal.     Breath sounds: Normal breath sounds.  Neurological:     Mental Status: He is alert.  Psychiatric:        Mood and Affect: Mood normal.        Behavior: Behavior normal.    Ortho Exam Examination of his left knee he has no effusion no swelling he has well-healed surgical portals from his arthroscopy.  He has joint line tenderness over the medial joint line.  She cannot flex past 90 degrees without significant pain.  He comes to full extension.  He has patellar grind with range of motion.  Good ligamentous stability Specialty Comments:  No specialty comments available.  Imaging: No results found.   PMFS History: Patient Active Problem List   Diagnosis Date Noted   Other tear of medial meniscus, current injury, left knee, subsequent encounter 04/26/2021   No past medical history on file.  No family history on file.  No past surgical history on file. Social History   Occupational History   Not  on file  Tobacco Use   Smoking status: Not on file   Smokeless tobacco: Not on file  Substance and Sexual Activity   Alcohol use: Not on file   Drug use: Not on file   Sexual activity: Not on file

## 2021-08-05 NOTE — Telephone Encounter (Signed)
Yes, it will be.  Thank you.

## 2021-08-11 ENCOUNTER — Ambulatory Visit (INDEPENDENT_AMBULATORY_CARE_PROVIDER_SITE_OTHER): Admitting: Orthopaedic Surgery

## 2021-08-11 ENCOUNTER — Encounter: Payer: Self-pay | Admitting: Orthopaedic Surgery

## 2021-08-11 ENCOUNTER — Other Ambulatory Visit: Payer: Self-pay

## 2021-08-11 DIAGNOSIS — M1712 Unilateral primary osteoarthritis, left knee: Secondary | ICD-10-CM | POA: Diagnosis not present

## 2021-08-11 MED ORDER — HYALURONAN 30 MG/2ML IX SOSY
30.0000 mg | PREFILLED_SYRINGE | INTRA_ARTICULAR | Status: AC | PRN
  Administered 2021-08-11: 15:00:00 30 mg via INTRA_ARTICULAR

## 2021-08-11 NOTE — Progress Notes (Signed)
   Office Visit Note   Patient: Fred Rodriguez           Date of Birth: 08/02/1961           MRN: [id number] Visit Date: 08/11/2021              Requested by: No referring provider defined for this encounter. PCP: Pcp, No   Assessment & Plan: Visit Diagnoses: No diagnosis found.  Plan: Patient presents for his first Orthovisc injection into his left knee.  He recently had injections into his back and he has found these quite successful with significant relief in his pain.  He tolerated the injection well will return in 1 week for his second injection  Follow-Up Instructions: No follow-ups on file.   Orders:  No orders of the defined types were placed in this encounter.  No orders of the defined types were placed in this encounter.     Procedures: Large Joint Inj: L knee on 08/11/2021 2:37 PM Indications: pain and diagnostic evaluation Details: 25 G 1.5 in needle  Arthrogram: No  Medications: 30 mg Hyaluronan 30 MG/2ML Outcome: tolerated well, no immediate complications Procedure, treatment alternatives, risks and benefits explained, specific risks discussed. Consent was given by the patient. Immediately prior to procedure a time out was called to verify the correct patient, procedure, equipment, support staff and site/side marked as required. Patient was prepped and draped in the usual sterile fashion.      Clinical Data: No additional findings.   Subjective: Chief Complaint  Patient presents with   Left Knee - Follow-up    Orthovisc  Patient presents today for orthovisc injection into his left knee. 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    Review of Systems  All other systems reviewed and are negative.   Objective: Vital Signs: There were no vitals taken for this visit.  Physical Exam Constitutional:      Appearance: Normal appearance.  Skin:    General: Skin is warm and dry.  Neurological:     Mental Status: He is alert.    Ortho Exam Examination of his knee he  does not have any effusion mild soft tissue swelling no erythema or cellulitis skin is in good condition.  He tolerated the injection well Specialty Comments:  No specialty comments available.  Imaging: No results found.   PMFS History: Patient Active Problem List   Diagnosis Date Noted   Other tear of medial meniscus, current injury, left knee, subsequent encounter 04/26/2021   No past medical history on file.  No family history on file.  No past surgical history on file. Social History   Occupational History   Not on file  Tobacco Use   Smoking status: Not on file   Smokeless tobacco: Not on file  Substance and Sexual Activity   Alcohol use: Not on file   Drug use: Not on file   Sexual activity: Not on file

## 2021-08-17 ENCOUNTER — Other Ambulatory Visit: Payer: Self-pay

## 2021-08-17 ENCOUNTER — Ambulatory Visit (INDEPENDENT_AMBULATORY_CARE_PROVIDER_SITE_OTHER): Admitting: Physician Assistant

## 2021-08-17 ENCOUNTER — Encounter: Payer: Self-pay | Admitting: Physician Assistant

## 2021-08-17 DIAGNOSIS — M1712 Unilateral primary osteoarthritis, left knee: Secondary | ICD-10-CM

## 2021-08-17 MED ORDER — HYALURONAN 30 MG/2ML IX SOSY
30.0000 mg | PREFILLED_SYRINGE | INTRA_ARTICULAR | Status: AC | PRN
  Administered 2021-08-17: 30 mg via INTRA_ARTICULAR

## 2021-08-17 NOTE — Progress Notes (Signed)
   Office Visit Note   Patient: Fred Rodriguez           Date of Birth: [birth year]           MRN: [id number] Visit Date: 08/17/2021              Requested by: No referring provider defined for this encounter. PCP: Pcp, No  Chief Complaint  Patient presents with   Left Knee - Follow-up      HPI: Patient presents today for his second Orthovisc injection into his left knee.  He does not feel a lot different after the first injection though he does say he can walk down stairs better  Assessment & Plan: Visit Diagnoses: No diagnosis found.  Plan: Second Orthovisc injection.  Patient tolerated it well.  We will follow-up in 1 week for final injection  Follow-Up Instructions: Return in about 1 week (around 08/24/2021).   Ortho Exam  Patient is alert, oriented, no adenopathy, well-dressed, normal affect, normal respiratory effort. Examination of his knee he has no effusion he has well-healed or healed surgical portals no swelling no redness skin is in good condition  Imaging: No results found. No images are attached to the encounter.  Labs: No results found for: HGBA1C, ESRSEDRATE, CRP, LABURIC, REPTSTATUS, GRAMSTAIN, CULT, LABORGA   No results found for: ALBUMIN, PREALBUMIN, CBC  No results found for: MG No results found for: VD25OH  No results found for: PREALBUMIN No flowsheet data found.   There is no height or weight on file to calculate BMI.  Orders:  No orders of the defined types were placed in this encounter.  No orders of the defined types were placed in this encounter.    Procedures: Large Joint Inj: L knee on 08/17/2021 1:46 PM Indications: pain and diagnostic evaluation Details: 25 G 1.5 in needle, anteromedial approach  Arthrogram: No  Medications: 30 mg Hyaluronan 30 MG/2ML Outcome: tolerated well, no immediate complications Procedure, treatment alternatives, risks and benefits explained, specific risks discussed. Consent was given by the  patient. Immediately prior to procedure a time out was called to verify the correct patient, procedure, equipment, support staff and site/side marked as required. Patient was prepped and draped in the usual sterile fashion.     Clinical Data: No additional findings.  ROS:  All other systems negative, except as noted in the HPI. Review of Systems  Objective: Vital Signs: There were no vitals taken for this visit.  Specialty Comments:  No specialty comments available.  PMFS History: Patient Active Problem List   Diagnosis Date Noted   Other tear of medial meniscus, current injury, left knee, subsequent encounter 04/26/2021   History reviewed. No pertinent past medical history.  History reviewed. No pertinent family history.  History reviewed. No pertinent surgical history. Social History   Occupational History   Not on file  Tobacco Use   Smoking status: Not on file   Smokeless tobacco: Not on file  Substance and Sexual Activity   Alcohol use: Not on file   Drug use: Not on file   Sexual activity: Not on file

## 2021-08-29 ENCOUNTER — Other Ambulatory Visit: Payer: Self-pay

## 2021-08-29 ENCOUNTER — Ambulatory Visit (INDEPENDENT_AMBULATORY_CARE_PROVIDER_SITE_OTHER): Admitting: Physician Assistant

## 2021-08-29 ENCOUNTER — Encounter: Payer: Self-pay | Admitting: Physician Assistant

## 2021-08-29 DIAGNOSIS — M1712 Unilateral primary osteoarthritis, left knee: Secondary | ICD-10-CM

## 2021-08-29 MED ORDER — HYALURONAN 30 MG/2ML IX SOSY
30.0000 mg | PREFILLED_SYRINGE | INTRA_ARTICULAR | Status: AC | PRN
  Administered 2021-08-29: 30 mg via INTRA_ARTICULAR

## 2021-08-29 NOTE — Progress Notes (Signed)
   Office Visit Note   Patient: Fred Rodriguez           Date of Birth: [birth year]           MRN: [id number] Visit Date: 08/29/2021              Requested by: No referring provider defined for this encounter. PCP: Pcp, No  Chief Complaint  Patient presents with   Left Knee - Follow-up    Orthovisc      HPI: Patient comes in today for his third Orthovisc injection into his left knee.  He is tolerated the previous injections without difficulty.  He is not sure if they have helped but he said he can do things that he previously could not do  Assessment & Plan: Visit Diagnoses: No diagnosis found.  Plan: Patient will follow-up in 1 month with Dr. Cleophas Dunker  Follow-Up Instructions: No follow-ups on file.   Ortho Exam  Patient is alert, oriented, no adenopathy, well-dressed, normal affect, normal respiratory effort. Left knee no effusion no redness no cellulitis no swelling.  Imaging: No results found. No images are attached to the encounter.  Labs: No results found for: HGBA1C, ESRSEDRATE, CRP, LABURIC, REPTSTATUS, GRAMSTAIN, CULT, LABORGA   No results found for: ALBUMIN, PREALBUMIN, CBC  No results found for: MG No results found for: VD25OH  No results found for: PREALBUMIN No flowsheet data found.   There is no height or weight on file to calculate BMI.  Orders:  No orders of the defined types were placed in this encounter.  No orders of the defined types were placed in this encounter.    Procedures: Large Joint Inj: L knee on 08/29/2021 1:21 PM Indications: pain and diagnostic evaluation Details: 25 G 1.5 in needle, anteromedial approach  Arthrogram: No  Medications: 30 mg Hyaluronan 30 MG/2ML Outcome: tolerated well, no immediate complications Consent was given by the patient.     Clinical Data: No additional findings.  ROS:  All other systems negative, except as noted in the HPI. Review of Systems  Objective: Vital Signs: There were no  vitals taken for this visit.  Specialty Comments:  No specialty comments available.  PMFS History: Patient Active Problem List   Diagnosis Date Noted   Other tear of medial meniscus, current injury, left knee, subsequent encounter 04/26/2021   History reviewed. No pertinent past medical history.  History reviewed. No pertinent family history.  History reviewed. No pertinent surgical history. Social History   Occupational History   Not on file  Tobacco Use   Smoking status: Not on file   Smokeless tobacco: Not on file  Substance and Sexual Activity   Alcohol use: Not on file   Drug use: Not on file   Sexual activity: Not on file

## 2021-09-29 ENCOUNTER — Other Ambulatory Visit: Payer: Self-pay

## 2021-09-29 ENCOUNTER — Encounter: Payer: Self-pay | Admitting: Orthopaedic Surgery

## 2021-09-29 ENCOUNTER — Ambulatory Visit (INDEPENDENT_AMBULATORY_CARE_PROVIDER_SITE_OTHER): Admitting: Orthopaedic Surgery

## 2021-09-29 DIAGNOSIS — S83242D Other tear of medial meniscus, current injury, left knee, subsequent encounter: Secondary | ICD-10-CM

## 2021-09-29 NOTE — Progress Notes (Signed)
° °  Office Visit Note   Patient: Fred Rodriguez           Date of Birth: 04-Sep-1961           MRN: [id number] Visit Date: 09/29/2021              Requested by: No referring provider defined for this encounter. PCP: Pcp, No   Assessment & Plan: Visit Diagnoses: No diagnosis found.  Plan: Patient is now 6 to 7 months status post his work-related injury to his left knee.  He is status post left knee arthroscopy 3 months ago.  He is also 1 month status post completion of viscosupplementation.  He reports he is doing better.  His deficits are that he has some strength issues walking down stairs.  He also notices some pain and instability when he turns to the left sharply.  Other than that he feels with regards to his knee he ready to go back to work.  We have given him a release that he has reached maximum medical improvement and may follow-up with [place] as needed.  He has a 15% disability with regards to his left lower extremity that is permanent.  Follow-Up Instructions: No follow-ups on file.   Orders:  No orders of the defined types were placed in this encounter.  No orders of the defined types were placed in this encounter.     Procedures: No procedures performed   Clinical Data: No additional findings.   Subjective: Chief Complaint  Patient presents with   Left Knee - Follow-up  Patient presents today for follow up on his left knee. He finished orthovisc in his left knee a month ago. He is doing well and has noticed improvement.    Review of Systems  All other systems reviewed and are negative.   Objective: Vital Signs: There were no vitals taken for this visit.  Physical Exam Constitutional:      Appearance: Normal appearance.  HENT:     Head: Atraumatic.  Neurological:     General: No focal deficit present.     Mental Status: He is alert.  Psychiatric:        Mood and Affect: Mood normal.        Behavior: Behavior normal.    Ortho Exam Examination of his  left knee no effusion no redness well-healed surgical portals.  He has good muscle bulk.  He has good flexion extension varus valgus stability.  No tenderness over the medial or lateral joint line. Specialty Comments:  No specialty comments available.  Imaging: No results found.   PMFS History: Patient Active Problem List   Diagnosis Date Noted   Other tear of medial meniscus, current injury, left knee, subsequent encounter 04/26/2021   No past medical history on file.  No family history on file.  No past surgical history on file. Social History   Occupational History   Not on file  Tobacco Use   Smoking status: Not on file   Smokeless tobacco: Not on file  Substance and Sexual Activity   Alcohol use: Not on file   Drug use: Not on file   Sexual activity: Not on file

## 2021-12-06 ENCOUNTER — Ambulatory Visit (INDEPENDENT_AMBULATORY_CARE_PROVIDER_SITE_OTHER): Admitting: Orthopaedic Surgery

## 2021-12-06 ENCOUNTER — Other Ambulatory Visit: Payer: Self-pay

## 2021-12-06 ENCOUNTER — Ambulatory Visit (INDEPENDENT_AMBULATORY_CARE_PROVIDER_SITE_OTHER)

## 2021-12-06 ENCOUNTER — Encounter: Payer: Self-pay | Admitting: Orthopaedic Surgery

## 2021-12-06 DIAGNOSIS — M1712 Unilateral primary osteoarthritis, left knee: Secondary | ICD-10-CM

## 2021-12-06 DIAGNOSIS — S83242D Other tear of medial meniscus, current injury, left knee, subsequent encounter: Secondary | ICD-10-CM

## 2021-12-06 NOTE — Progress Notes (Signed)
? ?Office Visit Note ?  ?Patient: Fred Rodriguez           ?Date of Birth: 1961-04-13           ?MRN: [id number] ?Visit Date: 12/06/2021 ?             ?Requested by: No referring provider defined for this encounter. ?PCP: Pcp, No ? ? ?Assessment & Plan: ?Visit Diagnoses:  ?1. Unilateral primary osteoarthritis, left knee   ?2. Other tear of medial meniscus, current injury, left knee, subsequent encounter   ? ? ?Plan: Mr. Leichter is status post on-the-job injury to his left knee in June 2022.  He had an MRI scan demonstrating a tear of the medial meniscus with some degenerative changes and underwent a knee arthroscopy in August 2022.  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  I did a chondroplasty.  He has had cortisone and even viscosupplementation and relates it made a difference but he still having some trouble with his knee.  I released him with a 15% permanent partial impairment of his left knee.  He notes that he still having some trouble to the point of compromise.  He thinks the problem is mostly with his back but has been urged to come back to the office for further evaluation.  He notes that he has difficulty getting up from a sitting position and if his buttock is below his knee in a sitting position he is better off in a higher elevation.  His knee does not swell and.  It is occasionally stiff.  He still going active treatment with Dr. Yevette Edwards for his back and is receiving a series of injections.  He thinks the problem is mostly with his back but it does affect the function of his knees.  I do not think that he is a candidate for knee replacement at this point.  Nonetheless, he does have a chronic problem with his knee and can provide "a Band-Aid approach" with over-the-counter medicines, Voltaren gel, ice  or heat.  Hopefully with warmer weather his knee will feel better.  I plan to see him back as needed and encouraged him to return if he had any questions.  He will continue to follow-up with Dr. Yevette Edwards for his back.  No change in his permanent partial impairment.  He notes the biggest problem he has is sitting  and,of course ,he is a Naval architect.  The issue is mostly with his back ? ?Follow-Up Instructions: Return if symptoms worsen or fail to improve.  ? ?Orders:  ?Orders Placed This Encounter  ?Procedures  ? XR KNEE 3 VIEW LEFT  ? ?No orders of the defined types were placed in this encounter. ? ? ? ? Procedures: ?No procedures performed ? ? ?Clinical Data: ?No additional findings. ? ? ?Subjective: ?Chief Complaint  ?Patient presents with  ? Left Knee - Pain  ?Patient presents today for his left knee pain. He is Workers Occupational hygienist. He said that both knees hurt now. He has pain medially. He has difficulty getting up from a resting position in his recliner. He is not currently working. His left knee is worse than his right. ? ?HPI ? ?Review of Systems ? ? ?Objective: ?Vital Signs: There were no vitals taken for this visit. ? ?Physical Exam ?Constitutional:   ?  Appearance: He is well-developed.  ?Eyes:  ?   Pupils: Pupils are equal, round, and reactive to light.  ?Pulmonary:  ?   Effort: Pulmonary effort is normal.  ?Skin: ?   General: Skin is warm and dry.  ?Neurological:  ?   Mental Status: He is alert and oriented to person, place, and time.  ?Psychiatric:     ?   Behavior: Behavior normal.  ? ? ?Ortho Exam awake alert and oriented x3.  Comfortable sitting.  Left knee was not hot warm or red.  No effusion.  Does have diffuse medial joint tenderness that is mild to moderate.  No lateral pain.  Does have positive patella crepitation but no pain with compression.  Full quick extension flexed over 105 degrees without instability.  No popliteal pain.  No pain with range of motion of the left hip.  No calf  discomfort. ? ?Specialty Comments:  ?No specialty comments available. ? ?Imaging: ?XR KNEE 3 VIEW LEFT ? ?Result Date: 12/06/2021 ?Films of the left knee were obtained in 3 projections standing.  The joint spaces are still maintained medially there is a little subchondral sclerosis on both the femur and the tibia without ectopic calcification.  Alignment is about neutral to maybe 1 degree of varus.  A little irregularity beneath the patella at the patellofemoral joint but no acute changes.  There were areas of grade II chondromalacia in all 3 compartments and particularly the patella trochlear at the time of arthroscopy  ? ? ?PMFS History: ?Patient Active Problem List  ? Diagnosis Date Noted  ? Unilateral primary osteoarthritis, left knee 12/06/2021  ? Other tear of medial meniscus, current injury, left knee, subsequent encounter 04/26/2021  ? ?History reviewed. No pertinent past medical history.  ?History reviewed. No pertinent family history.  ?History reviewed. No pertinent surgical history. ?Social History  ? ?Occupational History  ? Not on file  ?Tobacco Use  ? Smoking status: Not on file  ? Smokeless tobacco: Not on file  ?Substance and Sexual Activity  ? Alcohol use: Not on file  ? Drug use: Not on file  ? Sexual activity: Not on file  ? ? ? ? ? ? ?

## 2021-12-08 ENCOUNTER — Ambulatory Visit: Payer: Self-pay | Admitting: Orthopaedic Surgery

## 2022-06-26 ENCOUNTER — Emergency Department (HOSPITAL_COMMUNITY): Payer: 59

## 2022-06-26 ENCOUNTER — Emergency Department (HOSPITAL_COMMUNITY)
Admission: EM | Admit: 2022-06-26 | Discharge: 2022-06-26 | Disposition: A | Discharge status: Home / Self Care | Payer: 59 | Attending: Emergency Medicine | Admitting: Emergency Medicine

## 2022-06-26 DIAGNOSIS — S8012XA Contusion of left lower leg, initial encounter: Secondary | ICD-10-CM | POA: Diagnosis not present

## 2022-06-26 DIAGNOSIS — S46912A Strain of unspecified muscle, fascia and tendon at shoulder and upper arm level, left arm, initial encounter: Secondary | ICD-10-CM

## 2022-06-26 DIAGNOSIS — S199XXA Unspecified injury of neck, initial encounter: Secondary | ICD-10-CM | POA: Diagnosis not present

## 2022-06-26 DIAGNOSIS — Y92410 Unspecified street and highway as the place of occurrence of the external cause: Secondary | ICD-10-CM | POA: Insufficient documentation

## 2022-06-26 DIAGNOSIS — S161XXA Strain of muscle, fascia and tendon at neck level, initial encounter: Secondary | ICD-10-CM | POA: Insufficient documentation

## 2022-06-26 DIAGNOSIS — S86912A Strain of unspecified muscle(s) and tendon(s) at lower leg level, left leg, initial encounter: Secondary | ICD-10-CM | POA: Insufficient documentation

## 2022-06-26 MED ORDER — METHOCARBAMOL 750 MG PO TABS: 750.0000 mg | ORAL_TABLET | Freq: Three times a day (TID) | ORAL | 0 refills | Status: DC | PRN

## 2022-06-26 MED ORDER — OXYCODONE-ACETAMINOPHEN 5-325 MG PO TABS: 1.0000 | ORAL_TABLET | Freq: Four times a day (QID) | ORAL | 0 refills | Status: DC | PRN

## 2022-06-26 MED ORDER — OXYCODONE-ACETAMINOPHEN 5-325 MG PO TABS
1.0000 | ORAL_TABLET | Freq: Once | ORAL | Status: AC
  Administered 2022-06-26: 1 via ORAL
  Filled 2022-06-26: qty 1

## 2022-06-26 NOTE — ED Notes (Signed)
Rn reviewed discharge instructions with pt. Pt verbalized understanding and had no further questions. VSS upon discharge 
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
## 2022-06-26 NOTE — Discharge Instructions (Addendum)
It was our pleasure to provide your ER care today - we hope that you feel better.  Take motrin or aleve as need for pain. You may also take percocet as need for pain. No driving for the next 6 hours or when taking percocet. Also, do not take tylenol or acetaminophen containing medication when taking percocet.  You may take robaxin as need for muscle pain/spasm - no driving when taking.   Follow up with your doctor in 2-3 weeks if symptoms fail to improve/resolve.  Also follow up with your doctor in the next couple weeks regarding your blood pressure that is high today.   Return to ER if worse, new symptoms, new/severe pain, numbness/weakness, or other concern.

## 2022-07-24 DIAGNOSIS — M542 Cervicalgia: Secondary | ICD-10-CM | POA: Diagnosis not present

## 2022-07-24 DIAGNOSIS — M545 Low back pain, unspecified: Secondary | ICD-10-CM | POA: Diagnosis not present

## 2022-07-24 DIAGNOSIS — R208 Other disturbances of skin sensation: Secondary | ICD-10-CM | POA: Diagnosis not present

## 2022-07-24 DIAGNOSIS — S29012A Strain of muscle and tendon of back wall of thorax, initial encounter: Secondary | ICD-10-CM | POA: Diagnosis not present

## 2022-07-24 DIAGNOSIS — M62838 Other muscle spasm: Secondary | ICD-10-CM | POA: Diagnosis not present

## 2022-08-06 DIAGNOSIS — H10021 Other mucopurulent conjunctivitis, right eye: Secondary | ICD-10-CM | POA: Diagnosis not present

## 2022-08-24 DIAGNOSIS — H538 Other visual disturbances: Secondary | ICD-10-CM | POA: Diagnosis not present

## 2023-10-25 ENCOUNTER — Other Ambulatory Visit: Payer: Self-pay

## 2023-10-25 ENCOUNTER — Ambulatory Visit
Admission: EM | Admit: 2023-10-25 | Discharge: 2023-10-25 | Disposition: A | Discharge status: Home / Self Care | Payer: PRIVATE HEALTH INSURANCE | Attending: Family Medicine | Admitting: Family Medicine

## 2023-10-25 ENCOUNTER — Ambulatory Visit (INDEPENDENT_AMBULATORY_CARE_PROVIDER_SITE_OTHER): Payer: PRIVATE HEALTH INSURANCE | Admitting: Radiology

## 2023-10-25 DIAGNOSIS — R051 Acute cough: Secondary | ICD-10-CM

## 2023-10-25 DIAGNOSIS — J189 Pneumonia, unspecified organism: Secondary | ICD-10-CM

## 2023-10-25 MED ORDER — AMOXICILLIN 500 MG PO CAPS: 1000.0000 mg | ORAL_CAPSULE | Freq: Two times a day (BID) | ORAL | 0 refills | Status: AC

## 2023-10-25 MED ORDER — AZITHROMYCIN 250 MG PO TABS: 250.0000 mg | ORAL_TABLET | Freq: Every day | ORAL | 0 refills | Status: AC

## 2023-10-25 MED ORDER — IPRATROPIUM-ALBUTEROL 0.5-2.5 (3) MG/3ML IN SOLN
3.0000 mL | Freq: Once | RESPIRATORY_TRACT | Status: AC
  Administered 2023-10-25: 3 mL via RESPIRATORY_TRACT

## 2023-10-25 MED ORDER — ALBUTEROL SULFATE HFA 108 (90 BASE) MCG/ACT IN AERS: 2.0000 | INHALATION_SPRAY | RESPIRATORY_TRACT | 0 refills | Status: AC | PRN

## 2023-10-25 MED ORDER — PROMETHAZINE-DM 6.25-15 MG/5ML PO SYRP: 5.0000 mL | ORAL_SOLUTION | Freq: Four times a day (QID) | ORAL | 0 refills | Status: AC | PRN

## 2023-10-25 NOTE — ED Notes (Signed)
Walked 2 laps around building and pulse ox was steady at 93%.

## 2023-10-25 NOTE — Discharge Instructions (Addendum)
Go to the emergency department for worsening symptoms or concerns Take the antibiotics to completion Rest Increase fluid intake Recheck the doctor in 5 to 7 days  No smoking  The x-ray reading we discussed is preliminary. Your x-ray will be read by a radiologist in next few hours. If there is a discrepancy, you will be contacted, and instructed on a new plan for you care.

## 2023-10-25 NOTE — ED Triage Notes (Signed)
Pt presents with cough and fever x 6 days. Mucinex is the only medication taken with no relief. Pt states his current pain level is a 10/10. Pt states "I am coughing so hard, it makes me dizzy."

## 2023-10-25 NOTE — ED Provider Notes (Signed)
[facility]    CSN: [id number] Arrival date & time: 10/25/23  1703      History   Chief Complaint Chief Complaint  Patient presents with   Cough   Shortness of Breath    HPI [facility] is a 63 y.o. male.    Cough Associated symptoms: shortness of breath   Shortness of Breath Associated symptoms: cough   Cough for 6 days associated with gray to green sputum, has had fevers, fatigue, body aches, shortness of breath.  Denies headache, sore throat, rhinorrhea, nasal congestion, abdominal pain, vomiting, diarrhea.  Denies close contacts with illness.  He is a [employer].  Denies lower extremity pain or swelling, history of DVT, clotting disorders.  He is a smoker  History reviewed. No pertinent past medical history.  Patient Active Problem List   Diagnosis Date Noted   Unilateral primary osteoarthritis, left knee 12/06/2021   Other tear of medial meniscus, current injury, left knee, subsequent encounter 04/26/2021    History reviewed. No pertinent surgical history.     Home Medications    Prior to Admission medications   Medication Sig Start Date End Date Taking? Authorizing Provider  methocarbamol (ROBAXIN) 750 MG tablet Take 1 tablet (750 mg total) by mouth 3 (three) times daily as needed (muscle spasm/pain). 06/26/22   Cathren Laine, MD  oxyCODONE-acetaminophen (PERCOCET/ROXICET) 5-325 MG tablet Take 1-2 tablets by mouth every 6 (six) hours as needed for severe pain. 06/26/22   Cathren Laine, MD    Family History History reviewed. No pertinent family history.  Social History Social History   Tobacco Use   Smoking status: Every Day    Types: Cigarettes   Smokeless tobacco: Never  Vaping Use   Vaping status: Never Used  Substance Use Topics   Alcohol use: Yes   Drug use: Never     Allergies   Bee venom and Codeine   Review of Systems Review of Systems  Respiratory:  Positive for cough and shortness of breath.      Physical  Exam Triage Vital Signs ED Triage Vitals  Encounter Vitals Group     BP 10/25/23 1711 (!) 140/82     Systolic BP Percentile --      Diastolic BP Percentile --      Pulse Rate 10/25/23 1711 76     Resp 10/25/23 1711 20     Temp 10/25/23 1711 98 F (36.7 C)     Temp Source 10/25/23 1711 Oral     SpO2 10/25/23 1711 90 %     Weight --      Height 10/25/23 1720 6\' 1"  (1.854 m)     Head Circumference --      Peak Flow --      Pain Score 10/25/23 1719 10     Pain Loc --      Pain Education --      Exclude from Growth Chart --    No data found.  Updated Vital Signs BP (!) 140/82 (BP Location: Right Arm)   Pulse 76   Temp 98 F (36.7 C) (Oral)   Resp 20   Ht 6\' 1"  (1.854 m)   SpO2 90%   BMI 32.98 kg/m   Visual Acuity Right Eye Distance:   Left Eye Distance:   Bilateral Distance:    Right Eye Near:   Left Eye Near:    Bilateral Near:     Physical Exam Vitals and nursing note reviewed.  HENT:  Head: Normocephalic and atraumatic.     Mouth/Throat:     Mouth: Mucous membranes are moist.  Cardiovascular:     Rate and Rhythm: Normal rate and regular rhythm.  Pulmonary:     Effort: No respiratory distress.     Breath sounds: Decreased breath sounds present.     Comments: Decreased breath sounds throughout, resting pulse ox 88 to 90% on room air Musculoskeletal:     Cervical back: Neck supple.  Skin:    General: Skin is warm and dry.  Neurological:     Mental Status: He is alert and oriented to person, place, and time.    Ambulatory pulse ox increased to 93%  UC Treatments / Results  Labs (all labs ordered are listed, but only abnormal results are displayed) Labs Reviewed - No data to display  EKG   Radiology No results found.  Procedures Procedures (including critical care time)  Medications Ordered in UC Medications  ipratropium-albuterol (DUONEB) 0.5-2.5 (3) MG/3ML nebulizer solution 3 mL (has no administration in time range)    Initial  Impression / Assessment and Plan / UC Course  I have reviewed the triage vital signs and the nursing notes.  Pertinent labs & imaging results that were available during my care of the patient were reviewed by me and considered in my medical decision making (see chart for details).     63 year old smoker reports fever, productive cough, shortness of breath, fatigue, body aches for 6 days.  Nontoxic-appearing oxygen saturation is low and he has decreased air movement I recommended ED evaluation he declined.  Ambulatory pulse ox was 93%.  Chest x-ray obtained independently viewed by me suspect infiltrate right lower fields.  Examined after DuoNeb has increased breath sounds without rales rhonchi or wheezes.  Again discussed my concerns with patient regarding need for ED evaluation he declines.  Will start patient on oral antibiotics, cough medicine and inhaler, and ED precautions reviewed Final Clinical Impressions(s) / UC Diagnoses   Final diagnoses:  Acute cough   Discharge Instructions   None    ED Prescriptions   None    PDMP not reviewed this encounter.   Meliton Rattan, [place] 10/25/23 2841
